# Patient Record
Sex: Male | Born: 1985 | Race: White | Hispanic: No | Marital: Married | Smoking: Never smoker
Health system: Southern US, Community
[De-identification: ages and names within clinical notes are randomized; demographics above are authoritative.]

## PROBLEM LIST (undated history)

## (undated) DIAGNOSIS — R011 Cardiac murmur, unspecified: Secondary | ICD-10-CM

## (undated) DIAGNOSIS — J189 Pneumonia, unspecified organism: Secondary | ICD-10-CM

## (undated) DIAGNOSIS — T8859XA Other complications of anesthesia, initial encounter: Secondary | ICD-10-CM

## (undated) DIAGNOSIS — Z87442 Personal history of urinary calculi: Secondary | ICD-10-CM

---
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
## 2018-07-25 ENCOUNTER — Other Ambulatory Visit: Payer: Self-pay

## 2018-07-25 ENCOUNTER — Encounter (HOSPITAL_COMMUNITY): Payer: Self-pay

## 2018-07-25 ENCOUNTER — Emergency Department (HOSPITAL_COMMUNITY)
Admission: EM | Admit: 2018-07-25 | Discharge: 2018-07-25 | Disposition: A | Discharge status: Home / Self Care | Payer: Managed Care, Other (non HMO) | Attending: Emergency Medicine | Admitting: Emergency Medicine

## 2018-07-25 DIAGNOSIS — N23 Unspecified renal colic: Secondary | ICD-10-CM | POA: Insufficient documentation

## 2018-07-25 DIAGNOSIS — R109 Unspecified abdominal pain: Secondary | ICD-10-CM | POA: Diagnosis present

## 2018-07-25 LAB — CBC
HCT: 46.1 % (ref 39.0–52.0)
Hemoglobin: 15.9 g/dL (ref 13.0–17.0)
MCH: 29.8 pg (ref 26.0–34.0)
MCHC: 34.5 g/dL (ref 30.0–36.0)
MCV: 86.5 fL (ref 80.0–100.0)
Platelets: 258 10*3/uL (ref 150–400)
RBC: 5.33 MIL/uL (ref 4.22–5.81)
RDW: 11.7 % (ref 11.5–15.5)
WBC: 9.2 10*3/uL (ref 4.0–10.5)
nRBC: 0 % (ref 0.0–0.2)

## 2018-07-25 LAB — BASIC METABOLIC PANEL
Anion gap: 9 (ref 5–15)
BUN: 11 mg/dL (ref 6–20)
CO2: 26 mmol/L (ref 22–32)
Calcium: 9.5 mg/dL (ref 8.9–10.3)
Chloride: 104 mmol/L (ref 98–111)
Creatinine, Ser: 0.92 mg/dL (ref 0.61–1.24)
GFR calc Af Amer: >60 mL/min (ref >60)
GFR calc non Af Amer: >60 mL/min (ref >60)
Glucose, Bld: 143 mg/dL — ABNORMAL HIGH (ref 70–99)
Potassium: 3.6 mmol/L (ref 3.5–5.1)
Sodium: 139 mmol/L (ref 135–145)

## 2018-07-25 MED ORDER — ONDANSETRON HCL 4 MG/2ML IJ SOLN
4.0000 mg | Freq: Once | INTRAMUSCULAR | Status: DC
  Filled 2018-07-25: qty 2

## 2018-07-25 MED ORDER — MORPHINE SULFATE (PF) 4 MG/ML IV SOLN
4.0000 mg | Freq: Once | INTRAVENOUS | Status: AC
  Administered 2018-07-25: 4 mg via INTRAVENOUS
  Filled 2018-07-25: qty 1

## 2018-07-25 MED ORDER — KETOROLAC TROMETHAMINE 30 MG/ML IJ SOLN
30.0000 mg | Freq: Once | INTRAMUSCULAR | Status: AC
  Administered 2018-07-25: 30 mg via INTRAVENOUS
  Filled 2018-07-25: qty 1

## 2018-07-25 MED ORDER — SODIUM CHLORIDE 0.9 % IV BOLUS: 1000.0000 mL | Freq: Once | INTRAVENOUS | Status: DC

## 2018-07-25 MED ORDER — ONDANSETRON HCL 4 MG/2ML IJ SOLN
4.0000 mg | Freq: Once | INTRAMUSCULAR | Status: AC
  Administered 2018-07-25: 4 mg via INTRAVENOUS
  Filled 2018-07-25: qty 2

## 2018-07-25 MED ORDER — SODIUM CHLORIDE 0.9 % IV BOLUS
1000.0000 mL | Freq: Once | INTRAVENOUS | Status: AC
  Administered 2018-07-25: 1000 mL via INTRAVENOUS

## 2018-07-25 NOTE — Discharge Instructions (Signed)
Take motrin 800 mg every 6 hrs for the next 2 days   Take oxycodone as needed for break through pain   Take flomax as prescribed.   Call [facility] office tomorrow for appointment   Return to ER if you have worse flank pain, abdominal pain, fever, vomiting.

## 2018-07-25 NOTE — ED Triage Notes (Signed)
Pt. Was at his PCP today and was told to come to us for a kidney stone, It is 8mm and is blocking .  They gave him Oxycodone 5mg  and also Flomax, (Has not taken that)  He took Oxycodone  Around 1:24. Pt. Was vomiting last night not at the present time.   CT scan was completed today at [facility] in [place].  Pt. Is alert and oriented X4  Skin is p/w/d

## 2018-07-25 NOTE — ED Notes (Signed)
Pt stable and ambulatory for discharge, states understanding follow up.  

## 2018-07-25 NOTE — ED Notes (Signed)
Patient transported to X-ray 

## 2018-07-25 NOTE — ED Provider Notes (Signed)
[facility] EMERGENCY DEPARTMENT Provider Note   CSN: [id number] Arrival date & time: 07/25/18  1605     History   Chief Complaint Chief Complaint  Patient presents with  . Nephrolithiasis    HPI Scott Duffy is a 32 y.o. male history of kidney stones here presenting with left flank pain.  Patient states that he has left flank pain for the last 3 to 4 days.  He initially thought he strained his back.  Yesterday it became sharp and associated with vomiting.  He went to see his primary care doctor at [facility] today.  He had an outpatient CT renal stone study that showed 8 mm stone with mild hydro.  He did take oxycodone and was told to come to the ED for evaluation.  He states that his pain is controlled currently.  The history is provided by the patient.    History reviewed. No pertinent past medical history.  There are no active problems to display for this patient.   History reviewed. No pertinent surgical history.      Home Medications    Prior to Admission medications   Not on File    Family History No family history on file.  Social History Social History   Tobacco Use  . Smoking status: Never Smoker  . Smokeless tobacco: Never Used  Substance Use Topics  . Alcohol use: Never    Frequency: Never  . Drug use: Never     Allergies   Patient has no allergy information on record.   Review of Systems Review of Systems  Genitourinary: Positive for flank pain.  All other systems reviewed and are negative.    Physical Exam Updated Vital Signs BP (!) 140/94 (BP Location: Right Arm)   Pulse 75   Temp 98 F (36.7 C) (Oral)   Resp 18   Ht 5\' 10"  (1.778 m)   Wt 87.1 kg   SpO2 98%   BMI 27.55 kg/m   Physical Exam  Constitutional: He is oriented to person, place, and time. He appears well-developed and well-nourished.  Comfortable   HENT:  Head: Normocephalic.  Eyes: Pupils are equal, round, and reactive to light.  Conjunctivae and EOM are normal.  Neck: Normal range of motion. Neck supple.  Cardiovascular: Normal rate, regular rhythm and normal heart sounds.  Pulmonary/Chest: Effort normal and breath sounds normal. No stridor. No respiratory distress.  Abdominal: Soft. Bowel sounds are normal.  + mild L CVAT   Musculoskeletal: Normal range of motion.  Neurological: He is alert and oriented to person, place, and time. No cranial nerve deficit. Coordination normal.  Skin: Skin is warm. Capillary refill takes less than 2 seconds.  Psychiatric: He has a normal mood and affect.  Nursing note and vitals reviewed.    ED Treatments / Results  Labs (all labs ordered are listed, but only abnormal results are displayed) Labs Reviewed  BASIC METABOLIC PANEL - Abnormal; Notable for the following components:      Result Value   Glucose, Bld 143 (*)    All other components within normal limits  CBC  URINALYSIS, ROUTINE W REFLEX MICROSCOPIC    EKG None  Radiology No results found.  Procedures Procedures (including critical care time)  Medications Ordered in ED Medications - No data to display   Initial Impression / Assessment and Plan / ED Course  I have reviewed the triage vital signs and the nursing notes.  Pertinent labs & imaging results that were available  during my care of the patient were reviewed by me and considered in my medical decision making (see chart for details).    Scott Duffy is a 32 y.o. male here with L flank pain. CT outpatient showed 8 mm stone with mild hydro. UA yesterday showed no UTI. Will get CBC, BMP, consult urology. Pain controlled currently and he took oxycodone prior to arrival.   8:30 PM I talked to Dr. Charlynn Court from urology. He reviewed records and recommend that patient call office tomorrow for appointment. Patient initially had some pain and received toradol and morphine and pain controlled. Has motrin and oxycodone and flomax at home. Told him to call  urology office tomorrow.   Final Clinical Impressions(s) / ED Diagnoses   Final diagnoses:  None    ED Discharge Orders    None       Charlynne Pander, MD 07/25/18 2031

## 2018-07-26 ENCOUNTER — Other Ambulatory Visit: Payer: Self-pay

## 2018-07-26 ENCOUNTER — Encounter (HOSPITAL_COMMUNITY): Payer: Self-pay | Admitting: Emergency Medicine

## 2018-07-26 ENCOUNTER — Emergency Department (HOSPITAL_COMMUNITY)
Admission: EM | Admit: 2018-07-26 | Discharge: 2018-07-26 | Disposition: A | Discharge status: Home / Self Care | Payer: Managed Care, Other (non HMO) | Attending: Emergency Medicine | Admitting: Emergency Medicine

## 2018-07-26 DIAGNOSIS — Z79899 Other long term (current) drug therapy: Secondary | ICD-10-CM | POA: Insufficient documentation

## 2018-07-26 DIAGNOSIS — R109 Unspecified abdominal pain: Secondary | ICD-10-CM | POA: Diagnosis present

## 2018-07-26 DIAGNOSIS — N201 Calculus of ureter: Secondary | ICD-10-CM | POA: Diagnosis not present

## 2018-07-26 DIAGNOSIS — N23 Unspecified renal colic: Secondary | ICD-10-CM | POA: Insufficient documentation

## 2018-07-26 MED ORDER — ONDANSETRON 4 MG PO TBDP: 4.0000 mg | ORAL_TABLET | Freq: Three times a day (TID) | ORAL | 0 refills | Status: AC | PRN

## 2018-07-26 MED ORDER — KETOROLAC TROMETHAMINE 60 MG/2ML IM SOLN
30.0000 mg | Freq: Once | INTRAMUSCULAR | Status: AC
  Administered 2018-07-26: 30 mg via INTRAMUSCULAR
  Filled 2018-07-26: qty 2

## 2018-07-26 MED ORDER — ONDANSETRON 4 MG PO TBDP
4.0000 mg | ORAL_TABLET | Freq: Once | ORAL | Status: AC
  Administered 2018-07-26: 4 mg via ORAL
  Filled 2018-07-26: qty 1

## 2018-07-26 NOTE — ED Triage Notes (Signed)
Patient was dx with kidney stones at [facility] earlier tonight. Patient states he can take it because he can not keep water down and is in pain.

## 2018-07-26 NOTE — Discharge Instructions (Addendum)
You may take 600 mg of ibuprofen every 6 hours.  Additionally you can take 650 mg of Tylenol every 6 hours as well, alternating with Motrin every 3 hours.

## 2018-07-26 NOTE — ED Provider Notes (Signed)
[facility]-EMERGENCY DEPT Provider Note  CSN: [id number] Arrival date & time: 07/26/18 0446  Chief Complaint(s) Nephrolithiasis  HPI Scott Duffy is a 32 y.o. male diagnosed with an 8 mm left ureteral stone with mild hydronephrosis seen on CT scan yesterday at an outside facility presents with worsening of his left flank pain.  Patient was seen in [facility] after the CT scan was performed for evaluation.  At that time pain was controlled.  Labs were reassuring.  Urinalysis at the outside facility did not show evidence of urinary tract infection.  Patient reports that his cramping/stabbing pain became severe around 11 PM last night and was not controlled with oral medications that included 800 mg Motrin and 10 mg oxycodone.  States that he had associated nausea and nonbloody nonbilious emesis due to the severity of the pain.  He denied any associated fevers or chills.  Denied any other physical complaints.  Reports that in the last hour his pain has subsided and is now 5 out of 10.  HPI  Past Medical History History reviewed. No pertinent past medical history. There are no active problems to display for this patient.  Home Medication(s) Prior to Admission medications   Medication Sig Start Date End Date Taking? Authorizing Provider  ibuprofen (ADVIL,MOTRIN) 200 MG tablet Take 600 mg by mouth every 6 (six) hours as needed for moderate pain.   Yes [provider]  oxyCODONE (OXY IR/ROXICODONE) 5 MG immediate release tablet Take 5-10 mg by mouth every 4 (four) hours as needed for pain.  07/25/18  Yes [provider]  tamsulosin (FLOMAX) 0.4 MG CAPS capsule Take 0.4 mg by mouth daily. 07/25/18  Yes [provider]  ondansetron (ZOFRAN ODT) 4 MG disintegrating tablet Take 1 tablet (4 mg total) by mouth every 8 (eight) hours as needed for up to 3 days for nausea or vomiting. 07/26/18 07/29/18  Nira Conn, MD                                                                                                  Past Surgical History History reviewed. No pertinent surgical history. Family History History reviewed. No pertinent family history.  Social History Social History   Tobacco Use  . Smoking status: Never Smoker  . Smokeless tobacco: Never Used  Substance Use Topics  . Alcohol use: Never    Frequency: Never  . Drug use: Never   Allergies Patient has no known allergies.  Review of Systems Review of Systems All other systems are reviewed and are negative for acute change except as noted in the HPI  Physical Exam Vital Signs  I have reviewed the triage vital signs BP (!) 147/92 (BP Location: Left Arm)   Pulse 71   Temp 97.6 F (36.4 C) (Oral)   Resp 18   Ht 5\' 10"  (1.778 m)   Wt 87.1 kg   SpO2 99%   BMI 27.55 kg/m   Physical Exam  Constitutional: He is oriented to person, place, and time. He appears well-developed and well-nourished. No distress.  HENT:  Head: Normocephalic and  atraumatic.  Right Ear: External ear normal.  Left Ear: External ear normal.  Nose: Nose normal.  Mouth/Throat: Mucous membranes are normal. No trismus in the jaw.  Eyes: Conjunctivae and EOM are normal. No scleral icterus.  Neck: Normal range of motion and phonation normal.  Cardiovascular: Normal rate and regular rhythm.  Pulmonary/Chest: Effort normal. No stridor. No respiratory distress.  Abdominal: He exhibits no distension. There is no tenderness.  Musculoskeletal: Normal range of motion. He exhibits no edema.  Neurological: He is alert and oriented to person, place, and time.  Skin: He is not diaphoretic.  Psychiatric: He has a normal mood and affect. His behavior is normal.  Vitals reviewed.   ED Results and Treatments Labs (all labs ordered are listed, but only abnormal results are displayed) Labs Reviewed - No data to display                                                                                                                        EKG  EKG Interpretation  Date/Time:    Ventricular Rate:    PR Interval:    QRS Duration:   QT Interval:    QTC Calculation:   R Axis:     Text Interpretation:        Radiology No results found. Pertinent labs & imaging results that were available during my care of the patient were reviewed by me and considered in my medical decision making (see chart for details).  Medications Ordered in ED Medications  ketorolac (TORADOL) injection 30 mg (30 mg Intramuscular Given 07/26/18 0629)  ondansetron (ZOFRAN-ODT) disintegrating tablet 4 mg (4 mg Oral Given 07/26/18 [id number])                                                                                                                                    Procedures Procedures  (including critical care time)  Medical Decision Making / ED Course I have reviewed the nursing notes for this encounter and the patient's prior records (if available in EHR or on provided paperwork).    Patient presents with renal colic.  Pain appears to be subsiding.  He was given additional IM Toradol and allowed to take his home oxycodone.  He was also provided with ODT Zofran.   Following this pain improved significantly.  Patient has been able to tolerate oral intake.  The patient appears reasonably screened and/or stabilized for  discharge and I doubt any other medical condition or other [facility] requiring further screening, evaluation, or treatment in the ED at this time prior to discharge.  The patient is safe for discharge with strict return precautions.   Final Clinical Impression(s) / ED Diagnoses Final diagnoses:  Ureterolithiasis  Renal colic   Disposition: Discharge  Condition: Good  I have discussed the results, Dx and Tx plan with the patient who expressed understanding and agree(s) with the plan. Discharge instructions discussed at great length. The patient was given strict return  precautions who verbalized understanding of the instructions. No further questions at time of discharge.    ED Discharge Orders         Ordered    ondansetron (ZOFRAN ODT) 4 MG disintegrating tablet  Every 8 hours PRN     07/26/18 0737           Follow Up: Marcine Matar, MD [address] [place] [address] [phone]  Schedule an appointment as soon as possible for a visit  For close follow up to assess for renal stone     This chart was dictated using voice recognition software.  Despite best efforts to proofread,  errors can occur which can change the documentation meaning.   Nira Conn, MD 07/26/18 [phone]

## 2018-07-27 ENCOUNTER — Other Ambulatory Visit: Payer: Self-pay | Admitting: Urology

## 2018-08-02 ENCOUNTER — Encounter (HOSPITAL_COMMUNITY): Admission: RE | Payer: Self-pay | Source: Home / Self Care

## 2018-08-02 ENCOUNTER — Ambulatory Visit (HOSPITAL_COMMUNITY): Admission: RE | Admit: 2018-08-02 | Payer: Managed Care, Other (non HMO) | Source: Home / Self Care | Admitting: Urology

## 2018-08-02 SURGERY — LITHOTRIPSY, ESWL
Anesthesia: LOCAL | Laterality: Left

## 2018-08-08 ENCOUNTER — Other Ambulatory Visit: Payer: Self-pay | Admitting: Urology

## 2018-08-08 ENCOUNTER — Encounter (HOSPITAL_COMMUNITY): Payer: Self-pay | Admitting: General Practice

## 2018-08-16 ENCOUNTER — Encounter (HOSPITAL_COMMUNITY): Payer: Self-pay | Admitting: General Practice

## 2018-08-16 ENCOUNTER — Encounter (HOSPITAL_COMMUNITY): Payer: Self-pay | Admitting: Anesthesiology

## 2018-08-16 ENCOUNTER — Encounter (HOSPITAL_COMMUNITY)
Admission: RE | Disposition: A | Discharge status: Home / Self Care | Payer: Self-pay | Source: Home / Self Care | Attending: Urology

## 2018-08-16 ENCOUNTER — Ambulatory Visit (HOSPITAL_COMMUNITY): Payer: Managed Care, Other (non HMO)

## 2018-08-16 ENCOUNTER — Ambulatory Visit (HOSPITAL_COMMUNITY)
Admission: RE | Admit: 2018-08-16 | Discharge: 2018-08-16 | Disposition: A | Discharge status: Home / Self Care | Payer: Managed Care, Other (non HMO) | Attending: Urology | Admitting: Urology

## 2018-08-16 DIAGNOSIS — N201 Calculus of ureter: Secondary | ICD-10-CM | POA: Diagnosis present

## 2018-08-16 DIAGNOSIS — Z87442 Personal history of urinary calculi: Secondary | ICD-10-CM | POA: Diagnosis not present

## 2018-08-16 DIAGNOSIS — Z79899 Other long term (current) drug therapy: Secondary | ICD-10-CM | POA: Diagnosis not present

## 2018-08-16 HISTORY — DX: Personal history of urinary calculi: Z87.442

## 2018-08-16 HISTORY — PX: EXTRACORPOREAL SHOCK WAVE LITHOTRIPSY: SHX1557

## 2018-08-16 SURGERY — LITHOTRIPSY, ESWL
Anesthesia: General | Laterality: Left

## 2018-08-16 MED ORDER — DIPHENHYDRAMINE HCL 25 MG PO CAPS
25.0000 mg | ORAL_CAPSULE | ORAL | Status: AC
  Administered 2018-08-16: 25 mg via ORAL
  Filled 2018-08-16: qty 1

## 2018-08-16 MED ORDER — DIAZEPAM 5 MG PO TABS
10.0000 mg | ORAL_TABLET | ORAL | Status: AC
  Administered 2018-08-16: 10 mg via ORAL
  Filled 2018-08-16: qty 2

## 2018-08-16 MED ORDER — SODIUM CHLORIDE 0.9 % IV SOLN
INTRAVENOUS | Status: DC
  Administered 2018-08-16: 07:00:00 via INTRAVENOUS

## 2018-08-16 MED ORDER — OXYCODONE HCL 5 MG PO TABS: 5.0000 mg | ORAL_TABLET | ORAL | 0 refills | Status: DC | PRN

## 2018-08-16 MED ORDER — CIPROFLOXACIN HCL 500 MG PO TABS
500.0000 mg | ORAL_TABLET | ORAL | Status: AC
  Administered 2018-08-16: 500 mg via ORAL
  Filled 2018-08-16: qty 1

## 2018-08-16 NOTE — Discharge Instructions (Signed)
1 - You may pass small stones and have bloody urine on / off for up to 2 weeks. This is normal.  2 - Call MD or go to ER for fever >102, severe pain / nausea / vomiting not relieved by medications, or acute change in medical status

## 2018-08-16 NOTE — Brief Op Note (Signed)
08/16/2018  9:40 AM  PATIENT:  Vale Haven  32 y.o. male  PRE-OPERATIVE DIAGNOSIS:  LEFT URETERAL STONE  POST-OPERATIVE DIAGNOSIS:  * No post-op diagnosis entered *  PROCEDURE:  Procedure(s): EXTRACORPOREAL SHOCK WAVE LITHOTRIPSY (ESWL) (Left)  SURGEON:  Surgeon(s) and Role:    * Alexis Frock, MD - Primary  PHYSICIAN ASSISTANT:   ASSISTANTS: none   ANESTHESIA:   MAC  EBL:  minimal   BLOOD ADMINISTERED:none  DRAINS: none   LOCAL MEDICATIONS USED:  NONE  SPECIMEN:  No Specimen  DISPOSITION OF SPECIMEN:  N/A  COUNTS:  YES  TOURNIQUET:  * No tourniquets in log *  DICTATION: .Note written in paper chart  PLAN OF CARE: Discharge to home after PACU  PATIENT DISPOSITION:  PACU - hemodynamically stable.   Delay start of Pharmacological VTE agent (>24hrs) due to surgical blood loss or risk of bleeding: yes

## 2018-08-16 NOTE — H&P (Signed)
Scott CreeksDavid Duffy is an 32 y.o. male.    Chief Complaint: Pre-op LEFT Shockwave Lithotripsy  HPI:   1 - LEFT Ureteral Stone - 8x74mm left fusiform stone by ER CT 07/2018 on eval flank pain. Cr <1. No fevers.  Today "Scott Duffy" is seen to proceed with LEFT shockwave lithotripsy, no interval fevers. Stone was proximal about 3 weeks ago, now with distal progression, below SI joint, just mdial to stable phlebolith.   Past Medical History:  Diagnosis Date  . History of kidney stones     Past Surgical History:  Procedure Laterality Date  . WISDOM TOOTH EXTRACTION  2003    History reviewed. No pertinent family history. Social History:  reports that he has never smoked. He has never used smokeless tobacco. He reports that he does not drink alcohol or use drugs.  Allergies: No Known Allergies  No medications prior to admission.    No results found for this or any previous visit (from the past 48 hour(s)). No results found.  Review of Systems  Constitutional: Negative.  Negative for chills and fever.  HENT: Negative.   Eyes: Negative.   Respiratory: Negative.   Cardiovascular: Negative.   Gastrointestinal: Negative.   Genitourinary: Positive for flank pain.  Skin: Negative.   Neurological: Negative.   Endo/Heme/Allergies: Negative.   Psychiatric/Behavioral: Negative.     There were no vitals taken for this visit. Physical Exam  Constitutional: He appears well-developed.  HENT:  Head: Normocephalic.  Eyes: Pupils are equal, round, and reactive to light.  Neck: Normal range of motion.  Cardiovascular: Normal rate.  Respiratory: Effort normal.  GI: Soft.  Genitourinary:    Genitourinary Comments: Mild left CVAT   Musculoskeletal: Normal range of motion.  Neurological: He is alert.  Skin: Skin is warm.  Psychiatric: He has a normal mood and affect.     Assessment/Plan  Proceed as planned with LEFT shockwave lithotripsy. Risks, benefits, alternatives, expected treatment course  discussed previously and reiterated today.   Sebastian Acheheodore Sylvestre Rathgeber, MD 08/16/2018, 5:27 AM

## 2018-08-23 ENCOUNTER — Encounter (HOSPITAL_COMMUNITY): Payer: Self-pay | Admitting: Urology

## 2020-03-14 IMAGING — DX DG ABDOMEN 1V
1 series · 1 of 1 positions shown · non-contrast
Comparison: 07/26/2018.

CLINICAL DATA: Left ureteral stone.  Lithotripsy.

EXAM:
ABDOMEN - 1 VIEW

[abdomen kub]
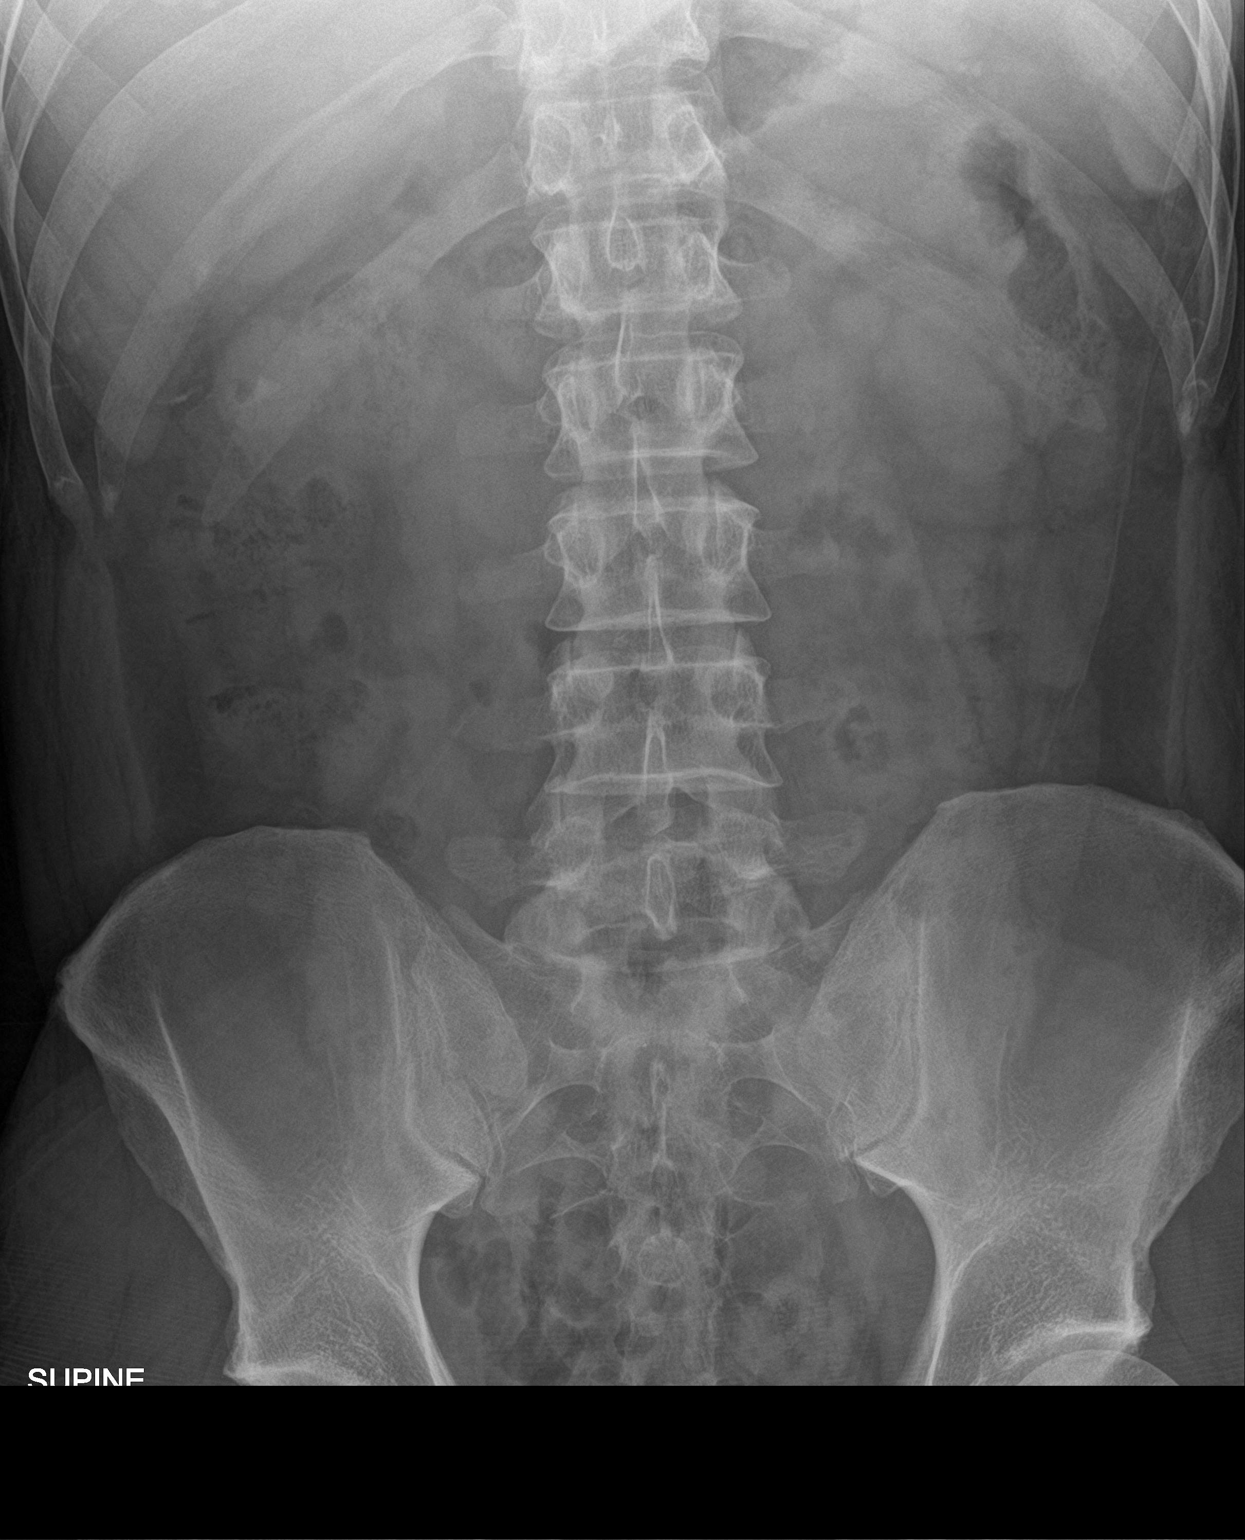

[1 of 1 positions shown; findings below may reference images not displayed]

FINDINGS: There is no evidence for gaseous bowel dilation to suggest
obstruction. The left ureteral stone seen on the previous x-ray has
migrated down to the pelvis and is likely in the distal ureter or
left UVJ. Phlebolith also noted left anatomic pelvis. Visualized
bony anatomy unremarkable.
IMPRESSION: Interval migration of the left ureteral stone now in the distal
ureter/UVJ.

## 2024-04-24 ENCOUNTER — Other Ambulatory Visit: Payer: Self-pay | Admitting: Urology

## 2024-05-24 NOTE — Progress Notes (Addendum)
 Anesthesia Review:  PCP: Comer Pinal LVO 04/10/24  [facility]  Cardiologist : none   PPM/ ICD: Device Orders: Rep Notified:  Chest x-ray : EKG : Echo : Stress test: Cardiac Cath :   Activity level: can do a flgiht of stairs without difficutyl  Sleep Study/ CPAP : sleep study negative per pt  Fasting Blood Sugar :      / Checks Blood Sugar -- times a day:    Blood Thinner/ Instructions /Last Dose: ASA / Instructions/ Last Dose :    PT was on way from [facility] on [address] at time of preop appt.  Completed as a phone appt .  Med hx and preop instructions completed as a phone call.

## 2024-05-24 NOTE — Patient Instructions (Addendum)
 SURGICAL WAITING ROOM VISITATION  Patients having surgery or a procedure may have no more than 2 support people in the waiting area - these visitors may rotate.    Children under the age of 71 must have an adult with them who is not the patient.  Visitors with respiratory illnesses are discouraged from visiting and should remain at home.  If the patient needs to stay at the hospital during part of their recovery, the visitor guidelines for inpatient rooms apply. Pre-op nurse will coordinate an appropriate time for 1 support person to accompany patient in pre-op.  This support person may not rotate.    Please refer to the [facility] website for the visitor guidelines for Inpatients (after your surgery is over and you are in a regular room).       Your procedure is scheduled on:  06/11/2024    Report to [facility] Entrance    Report to admitting at  0730 AM   Call this number if you have problems the morning of surgery [phone]   Do not eat food  or drink liquids :After Midnight.                 If you have questions, please contact your surgeon's office.       Oral Hygiene is also important to reduce your risk of infection.                                    Remember - BRUSH YOUR TEETH THE MORNING OF SURGERY WITH YOUR REGULAR TOOTHPASTE  DENTURES WILL BE REMOVED PRIOR TO SURGERY PLEASE DO NOT APPLY Poly grip OR ADHESIVES!!!   Do NOT smoke after Midnight   Stop all vitamins and herbal supplements 7 days before surgery.   Take these medicines the morning of surgery with A SIP OF WATER:  flomax   DO NOT TAKE ANY ORAL DIABETIC MEDICATIONS DAY OF YOUR SURGERY  Bring CPAP mask and tubing day of surgery.                              You may not have any metal on your body including hair pins, jewelry, and body piercing             Do not wear make-up, lotions, powders, perfumes/cologne, or deodorant  Do not wear nail polish including gel and S&S,  artificial/acrylic nails, or any other type of covering on natural nails including finger and toenails. If you have artificial nails, gel coating, etc. that needs to be removed by a nail salon please have this removed prior to surgery or surgery may need to be canceled/ delayed if the surgeon/ anesthesia feels like they are unable to be safely monitored.   Do not shave  48 hours prior to surgery.               Men may shave face and neck.   Do not bring valuables to the hospital.  IS NOT             RESPONSIBLE   FOR VALUABLES.   Contacts, glasses, dentures or bridgework may not be worn into surgery.   Bring small overnight bag day of surgery.   DO NOT BRING YOUR HOME MEDICATIONS TO THE HOSPITAL. PHARMACY WILL DISPENSE MEDICATIONS LISTED ON YOUR MEDICATION LIST TO YOU DURING YOUR  ADMISSION IN THE HOSPITAL!    Patients discharged on the day of surgery will not be allowed to drive home.  Someone NEEDS to stay with you for the first 24 hours after anesthesia.   Special Instructions: Bring a copy of your healthcare power of attorney and living will documents the day of surgery if you haven't scanned them before.              Please read over the following fact sheets you were given: IF YOU HAVE QUESTIONS ABOUT YOUR PRE-OP INSTRUCTIONS PLEASE CALL [phone].   If you received a COVID test during your pre-op visit  it is requested that you wear a mask when out in public, stay away from anyone that may not be feeling well and notify your surgeon if you develop symptoms. If you test positive for Covid or have been in contact with anyone that has tested positive in the last 10 days please notify you surgeon.    [facility] - Preparing for Surgery Before surgery, you can play an important role.  Because skin is not sterile, your skin needs to be as free of germs as possible.  You can reduce the number of germs on your skin by washing with CHG (chlorahexidine gluconate) soap before surgery.   CHG is an antiseptic cleaner which kills germs and bonds with the skin to continue killing germs even after washing. Please DO NOT use if you have an allergy to CHG or antibacterial soaps.  If your skin becomes reddened/irritated stop using the CHG and inform your nurse when you arrive at Short Stay. Do not shave (including legs and underarms) for at least 48 hours prior to the first CHG shower.  You may shave your face/neck.  Please follow these instructions carefully:  1.  Shower with CHG Soap the night before surgery ONLY (DO NOT USE THE SOAP THE MORNING OF SURGERY).  2.  If you choose to wash your hair, wash your hair first as usual with your normal  shampoo.  3.  After you shampoo, rinse your hair and body thoroughly to remove the shampoo.                             4.  Use CHG as you would any other liquid soap.  You can apply chg directly to the skin and wash.  Gently with a scrungie or clean washcloth.  5.  Apply the CHG Soap to your body ONLY FROM THE NECK DOWN.   Do   not use on face/ open                           Wound or open sores. Avoid contact with eyes, ears mouth and   genitals (private parts).                       Wash face,  Genitals (private parts) with your normal soap.             6.  Wash thoroughly, paying special attention to the area where your    surgery  will be performed.  7.  Thoroughly rinse your body with warm water from the neck down.  8.  DO NOT shower/wash with your normal soap after using and rinsing off the CHG Soap.                9.  Pat yourself dry with a clean towel.            10.  Wear clean pajamas.            11.  Place clean sheets on your bed the night of your first shower and do not  sleep with pets. Day of Surgery : Do not apply any CHG, lotions/deodorants the morning of surgery.  Please wear clean clothes to the hospital/surgery center.  FAILURE TO FOLLOW THESE INSTRUCTIONS MAY RESULT IN THE CANCELLATION OF YOUR SURGERY  PATIENT  SIGNATURE_________________________________  NURSE SIGNATURE__________________________________  ________________________________________________________________________

## 2024-05-27 ENCOUNTER — Other Ambulatory Visit: Payer: Self-pay

## 2024-05-27 ENCOUNTER — Encounter (HOSPITAL_COMMUNITY)
Admission: RE | Admit: 2024-05-27 | Discharge: 2024-05-27 | Disposition: A | Discharge status: Home / Self Care | Source: Ambulatory Visit | Attending: Urology | Admitting: Urology

## 2024-05-27 ENCOUNTER — Encounter (HOSPITAL_COMMUNITY): Payer: Self-pay | Admitting: Urology

## 2024-05-27 DIAGNOSIS — Z01818 Encounter for other preprocedural examination: Secondary | ICD-10-CM

## 2024-06-09 NOTE — H&P (Signed)
 Scott Duffy is a 38 yo male who presents to discuss a vasectomy. He has four children 4-11. He has a history of a stone with ESWL in 2019. He may have had a stone in December but is asymptomatic now and has no other GU history.     ALLERGIES: Nkda    MEDICATIONS: Omeprazole  Zyrtec     GU PSH: ESWL, Left - 2019       PSH Notes: [place] teeth   NON-GU PSH: No Non-GU PSH    GU PMH: Encounter for sterilization, He is a good candidate for a vasectomy but has a compact scrotum and had a vasovagal reaction to the exam. He will need to be done in the OR. I reviewed the risks of bleeding, infection, testicular atrophy, chronic pain and failure of the procedure as well as anesthetic complications. I also reviewed the need for a semen specimen in 3 months to confirm the outcome. he was given our pamphlet to review and the postoperative instructions and expectations were reviewed. - 12/28/2022 Ureteral calculus (Acute), Left, After discussing the options for management he said he wanted to try to pass the stone but his wife is scheduled to get a C-section in a week or so and he elected to proceed with scheduling of his lithotripsy and will contact us  if he passes his stone in the interim. - 2019    NON-GU PMH: GERD    FAMILY HISTORY: 2 daughters - Daughter 1 son - Son Kidney Stones - Grandfather   SOCIAL HISTORY: Marital Status: Married Preferred Language: English; Ethnicity: Not Hispanic Or Latino; Race: White Current Smoking Status: Patient has never smoked.   Tobacco Use Assessment Completed: Used Tobacco in last 30 days? Has never drank.  Drinks 2 caffeinated drinks per day. 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  Gastrointestinal (Upper):   Patient denies nausea, vomiting, and indigestion/ heartburn.  Gastrointestinal (Lower):   Patient denies diarrhea and constipation.  Constitutional:   Patient denies fever, night sweats, weight loss, and fatigue.  Skin:   Patient denies skin rash/ lesion and itching.  Eyes:   Patient denies blurred vision and double vision.  Ears/ Nose/ Throat:   Patient denies sore throat and sinus problems.  Hematologic/Lymphatic:   Patient denies swollen glands and easy bruising.  Cardiovascular:   Patient denies leg swelling and chest pains.  Respiratory:   Patient denies cough and shortness of breath.  Endocrine:   Patient denies excessive thirst.  Musculoskeletal:   Patient denies back pain and joint pain.  Neurological:   Patient denies headaches and dizziness.  Psychologic:   Patient denies depression and anxiety.   VITAL SIGNS: None   MULTI-SYSTEM PHYSICAL EXAMINATION:    Constitutional: Well-nourished. No physical deformities. Normally developed. Good grooming.  Respiratory: Normal breath sounds. No labored breathing, no use of accessory muscles.   Cardiovascular: Regular rate and rhythm. No murmur, no gallop.      Complexity of Data:  Records Review:   Previous Patient Records  Urine Test Review:   Urinalysis   PROCEDURES:          Visit Complexity - G2211 Chronic management  Urinalysis Dipstick Dipstick Cont'd  Color: Yellow Bilirubin: Neg mg/dL  Appearance: Clear Ketones: Neg mg/dL  Specific Gravity: 8.974 Blood: Neg ery/uL  pH: 5.5 Protein: Neg mg/dL  Glucose: Neg mg/dL Urobilinogen: 0.2 mg/dL    Nitrites: Neg    Leukocyte Esterase: Neg leu/uL    ASSESSMENT:      ICD-10 Details  1 GU:   Encounter for sterilization - Z30.2 Chronic, Stable - He wants to proceed with a vasectomy as we discussed before. I will get it set up in the OR as he had a vasovagal reaction that was severe just with the exam at his last  assessment.   He is a good candidate for a vasectomy.   I reviewed the risks of bleeding, infection, testicular atrophy, chronic pain and failure of the procedure. I also reviewed the need for a semen specimen in 3 months to confirm the outcome. he was given our pamphlet to review and the postoperative instructions and expectations were reviewed.       PLAN:           Schedule Return Visit/Planned Activity: Next Available Appointment - Schedule Surgery  Procedure: Unspecified Date - Vasectomy - [phone] Notes: Next available

## 2024-06-11 ENCOUNTER — Encounter (HOSPITAL_COMMUNITY): Payer: Self-pay | Admitting: Urology

## 2024-06-11 ENCOUNTER — Encounter (HOSPITAL_COMMUNITY)
Admission: RE | Disposition: A | Discharge status: Home / Self Care | Payer: Self-pay | Source: Home / Self Care | Attending: Urology

## 2024-06-11 ENCOUNTER — Ambulatory Visit (HOSPITAL_COMMUNITY): Payer: Self-pay | Admitting: Medical

## 2024-06-11 ENCOUNTER — Ambulatory Visit (HOSPITAL_COMMUNITY)
Admission: RE | Admit: 2024-06-11 | Discharge: 2024-06-11 | Disposition: A | Discharge status: Home / Self Care | Attending: Urology | Admitting: Urology

## 2024-06-11 ENCOUNTER — Ambulatory Visit (HOSPITAL_BASED_OUTPATIENT_CLINIC_OR_DEPARTMENT_OTHER): Payer: Self-pay | Admitting: Certified Registered"

## 2024-06-11 DIAGNOSIS — K219 Gastro-esophageal reflux disease without esophagitis: Secondary | ICD-10-CM | POA: Insufficient documentation

## 2024-06-11 DIAGNOSIS — Z302 Encounter for sterilization: Secondary | ICD-10-CM | POA: Diagnosis not present

## 2024-06-11 DIAGNOSIS — Z01818 Encounter for other preprocedural examination: Secondary | ICD-10-CM

## 2024-06-11 HISTORY — DX: Other complications of anesthesia, initial encounter: T88.59XA

## 2024-06-11 HISTORY — DX: Cardiac murmur, unspecified: R01.1

## 2024-06-11 HISTORY — PX: VASECTOMY: SHX75

## 2024-06-11 HISTORY — DX: Pneumonia, unspecified organism: J18.9

## 2024-06-11 LAB — CBC
HCT: 43.8 % (ref 39.0–52.0)
Hemoglobin: 15.4 g/dL (ref 13.0–17.0)
MCH: 30.5 pg (ref 26.0–34.0)
MCHC: 35.2 g/dL (ref 30.0–36.0)
MCV: 86.7 fL (ref 80.0–100.0)
Platelets: 262 K/uL (ref 150–400)
RBC: 5.05 MIL/uL (ref 4.22–5.81)
RDW: 12.1 % (ref 11.5–15.5)
WBC: 6.1 K/uL (ref 4.0–10.5)
nRBC: 0 % (ref 0.0–0.2)

## 2024-06-11 SURGERY — VASECTOMY
Anesthesia: General | Site: Scrotum | Laterality: Bilateral

## 2024-06-11 MED ORDER — ONDANSETRON HCL 4 MG/2ML IJ SOLN
INTRAMUSCULAR | Status: DC | PRN
  Administered 2024-06-11: 4 mg via INTRAVENOUS

## 2024-06-11 MED ORDER — LIDOCAINE HCL (CARDIAC) PF 100 MG/5ML IV SOSY
PREFILLED_SYRINGE | INTRAVENOUS | Status: DC | PRN
  Administered 2024-06-11: 100 mg via INTRAVENOUS

## 2024-06-11 MED ORDER — OXYCODONE HCL 5 MG PO TABS: 5.0000 mg | ORAL_TABLET | ORAL | Status: DC | PRN

## 2024-06-11 MED ORDER — MIDAZOLAM HCL 2 MG/2ML IJ SOLN
INTRAMUSCULAR | Status: AC
  Filled 2024-06-11: qty 2

## 2024-06-11 MED ORDER — HYDROCODONE-ACETAMINOPHEN 5-325 MG PO TABS: 1.0000 | ORAL_TABLET | Freq: Four times a day (QID) | ORAL | 0 refills | Status: AC | PRN

## 2024-06-11 MED ORDER — ACETAMINOPHEN 10 MG/ML IV SOLN: 1000.0000 mg | Freq: Once | INTRAVENOUS | Status: DC | PRN

## 2024-06-11 MED ORDER — LIDOCAINE HCL (PF) 2 % IJ SOLN
INTRAMUSCULAR | Status: AC
  Filled 2024-06-11: qty 5

## 2024-06-11 MED ORDER — AMISULPRIDE (ANTIEMETIC) 5 MG/2ML IV SOLN
10.0000 mg | Freq: Once | INTRAVENOUS | Status: AC
  Administered 2024-06-11: 10 mg via INTRAVENOUS

## 2024-06-11 MED ORDER — MIDAZOLAM HCL (PF) 2 MG/2ML IJ SOLN
INTRAMUSCULAR | Status: DC | PRN
  Administered 2024-06-11: 2 mg via INTRAVENOUS

## 2024-06-11 MED ORDER — CHLORHEXIDINE GLUCONATE 0.12 % MT SOLN
15.0000 mL | Freq: Once | OROMUCOSAL | Status: AC
  Administered 2024-06-11: 15 mL via OROMUCOSAL

## 2024-06-11 MED ORDER — 0.9 % SODIUM CHLORIDE (POUR BTL) OPTIME
TOPICAL | Status: DC | PRN
Start: 2024-06-11 — End: 2024-06-11
  Administered 2024-06-11: 1000 mL

## 2024-06-11 MED ORDER — PROPOFOL 10 MG/ML IV BOLUS
INTRAVENOUS | Status: AC
  Filled 2024-06-11: qty 20

## 2024-06-11 MED ORDER — FENTANYL CITRATE (PF) 50 MCG/ML IJ SOSY: 25.0000 ug | PREFILLED_SYRINGE | INTRAMUSCULAR | Status: DC | PRN

## 2024-06-11 MED ORDER — FENTANYL CITRATE (PF) 100 MCG/2ML IJ SOLN
INTRAMUSCULAR | Status: AC
  Filled 2024-06-11: qty 2

## 2024-06-11 MED ORDER — PROPOFOL 10 MG/ML IV BOLUS
INTRAVENOUS | Status: DC | PRN
  Administered 2024-06-11: 200 mg via INTRAVENOUS

## 2024-06-11 MED ORDER — ONDANSETRON HCL 4 MG/2ML IJ SOLN
INTRAMUSCULAR | Status: AC
  Filled 2024-06-11: qty 2

## 2024-06-11 MED ORDER — MORPHINE SULFATE (PF) 2 MG/ML IV SOLN: 2.0000 mg | INTRAVENOUS | Status: DC | PRN

## 2024-06-11 MED ORDER — BACITRACIN ZINC 500 UNIT/GM EX OINT
TOPICAL_OINTMENT | CUTANEOUS | Status: AC
  Filled 2024-06-11: qty 28.35

## 2024-06-11 MED ORDER — BUPIVACAINE HCL (PF) 0.5 % IJ SOLN
INTRAMUSCULAR | Status: AC
  Filled 2024-06-11: qty 30

## 2024-06-11 MED ORDER — BUPIVACAINE HCL (PF) 0.5 % IJ SOLN
INTRAMUSCULAR | Status: DC | PRN
  Administered 2024-06-11: 5 mL

## 2024-06-11 MED ORDER — DEXAMETHASONE SOD PHOSPHATE PF 10 MG/ML IJ SOLN
INTRAMUSCULAR | Status: DC | PRN
  Administered 2024-06-11: 8 mg via INTRAVENOUS

## 2024-06-11 MED ORDER — FENTANYL CITRATE (PF) 100 MCG/2ML IJ SOLN
INTRAMUSCULAR | Status: DC | PRN
  Administered 2024-06-11: 50 ug via INTRAVENOUS

## 2024-06-11 MED ORDER — ORAL CARE MOUTH RINSE: 15.0000 mL | Freq: Once | OROMUCOSAL | Status: AC

## 2024-06-11 MED ORDER — CEFAZOLIN SODIUM-DEXTROSE 2-4 GM/100ML-% IV SOLN
2.0000 g | INTRAVENOUS | Status: AC
Start: 2024-06-11 — End: 2024-06-11
  Administered 2024-06-11: 2 g via INTRAVENOUS
  Filled 2024-06-11: qty 100

## 2024-06-11 MED ORDER — ACETAMINOPHEN 325 MG PO TABS: 650.0000 mg | ORAL_TABLET | ORAL | Status: DC | PRN

## 2024-06-11 MED ORDER — SODIUM CHLORIDE 0.9% FLUSH: 3.0000 mL | INTRAVENOUS | Status: DC | PRN

## 2024-06-11 MED ORDER — SODIUM CHLORIDE 0.9 % IV SOLN: 250.0000 mL | INTRAVENOUS | Status: DC | PRN

## 2024-06-11 MED ORDER — LACTATED RINGERS IV SOLN
INTRAVENOUS | Status: DC
Start: 2024-06-11 — End: 2024-06-11

## 2024-06-11 MED ORDER — ACETAMINOPHEN 650 MG RE SUPP: 650.0000 mg | RECTAL | Status: DC | PRN

## 2024-06-11 MED ORDER — SODIUM CHLORIDE 0.9% FLUSH: 3.0000 mL | Freq: Two times a day (BID) | INTRAVENOUS | Status: DC

## 2024-06-11 SURGICAL SUPPLY — 14 items
BAG COUNTER SPONGE SURGICOUNT (BAG) IMPLANT
BLADE SURG 15 STRL LF DISP TIS (BLADE) ×1 IMPLANT
CLIP TI MEDIUM 6 (CLIP) ×1 IMPLANT
COVER SURGICAL LIGHT HANDLE (MISCELLANEOUS) ×1 IMPLANT
GAUZE 4X4 16PLY ~~LOC~~+RFID DBL (SPONGE) ×1 IMPLANT
GLOVE SURG LX STRL 8.0 MICRO (GLOVE) ×1 IMPLANT
GOWN STRL REUS W/ TWL XL LVL3 (GOWN DISPOSABLE) ×1 IMPLANT
KIT TURNOVER KIT A (KITS) ×1 IMPLANT
NDL DENTAL RB 25GX1.25 (NEEDLE) IMPLANT
NEEDLE DENTAL RB 25GX1.25 (NEEDLE) ×1 IMPLANT
PACK BASIC VI WITH GOWN DISP (CUSTOM PROCEDURE TRAY) ×1 IMPLANT
PENCIL SMOKE EVACUATOR (MISCELLANEOUS) IMPLANT
SUT CHROMIC 3 0 SH 27 (SUTURE) ×1 IMPLANT
SUT CHROMIC 4 0 FS 2 27 (SUTURE) IMPLANT

## 2024-06-11 NOTE — Op Note (Signed)
 Procedure: Bilateral vasectomy.  Preop diagnosis: Elective sterilization.  Postop diagnosis: Same.  Surgeon: Dr. Norleen Seltzer.  Anesthesia: General.  Specimen: None.  Drains: None.  EBL: None.  Complications: None.  Indications: Patient is a 38 year old male who had a severe vasovagal reaction in the office during his initial vasectomy consultation exam it was felt that and the procedure in the OR was indicated.  Procedure: He was taken operating room was given Ancef.  A general anesthetic was induced.  He was left in the supine position.  His scrotum was clipped and he was prepped with Betadine solution and draped in usual sterile fashion.  No scalpel vasectomy was performed after initial injection of the scrotal skin with half percent Marcaine.  A total of 5 mL was used.  The sharp and hemostat was used to puncture the skin against the right vas which was being palpated against the anterior scrotal wall.  The vas was dissected out and a 3 cm section was skeletonized.  A 1 cm section was removed with the scissors.  The distal lumen was fulgurated with the Bovie.  A fascial interposition was then performed using 3-0 chromic.  The testicular end was then doubly ligated with a 3-0 chromic.  This was then repeated on the left side.  Once hemostasis was assured a subcuticular skin closure was performed with a single 4-0 chromic suture.  Wound was cleansed and a dressing was applied and secured with mesh panties.  His anesthetic was reversed and he was moved recovery in stable condition.  There were no complications.  He is aware that the procedure is not immediately effective and he will need semen Alysis in 3 months prior to relying on the vasectomy for birth control.

## 2024-06-11 NOTE — Interval H&P Note (Signed)
 History and Physical Interval Note: no change  06/11/2024 8:42 AM  Scott Duffy  has presented today for surgery, with the diagnosis of VASECTOMY.  The various methods of treatment have been discussed with the patient and family. After consideration of risks, benefits and other options for treatment, the patient has consented to  Procedure(s): VASECTOMY (Bilateral) as a surgical intervention.  The patient's history has been reviewed, patient examined, no change in status, stable for surgery.  I have reviewed the patient's chart and labs.  Questions were answered to the patient's satisfaction.     [name]

## 2024-06-11 NOTE — Anesthesia Preprocedure Evaluation (Addendum)
 Anesthesia Evaluation  Patient identified by MRN, date of birth, ID band Patient awake    Reviewed: Allergy & Precautions, NPO status , Patient's Chart, lab work & pertinent test results  Airway Mallampati: II  TM Distance: >3 FB Neck ROM: Full    Dental no notable dental hx.    Pulmonary neg pulmonary ROS   Pulmonary exam normal        Cardiovascular negative cardio ROS  Rhythm:Regular Rate:Normal     Neuro/Psych negative neurological ROS  negative psych ROS   GI/Hepatic Neg liver ROS,GERD  Medicated,,  Endo/Other  negative endocrine ROS    Renal/GU negative Renal ROS  negative genitourinary   Musculoskeletal negative musculoskeletal ROS (+)    Abdominal Normal abdominal exam  (+)   Peds  Hematology negative hematology ROS (+)   Anesthesia Other Findings   Reproductive/Obstetrics                              Anesthesia Physical Anesthesia Plan  ASA: 2  Anesthesia Plan: General   Post-op Pain Management:    Induction: Intravenous  PONV Risk Score and Plan: 1 and Ondansetron , Dexamethasone, Propofol infusion, Midazolam and Treatment may vary due to age or medical condition  Airway Management Planned: Mask and LMA  Additional Equipment: None  Intra-op Plan:   Post-operative Plan: Extubation in OR  Informed Consent: I have reviewed the patients History and Physical, chart, labs and discussed the procedure including the risks, benefits and alternatives for the proposed anesthesia with the patient or authorized representative who has indicated his/her understanding and acceptance.     Dental advisory given  Plan Discussed with: CRNA  Anesthesia Plan Comments:          Anesthesia Quick Evaluation

## 2024-06-11 NOTE — Anesthesia Postprocedure Evaluation (Signed)
 Anesthesia Post Note  Patient: Scott Duffy  Procedure(s) Performed: VASECTOMY (Bilateral: Scrotum)     Patient location during evaluation: PACU Anesthesia Type: General Level of consciousness: awake and alert Pain management: pain level controlled Vital Signs Assessment: post-procedure vital signs reviewed and stable Respiratory status: spontaneous breathing, nonlabored ventilation, respiratory function stable and patient connected to nasal cannula oxygen Cardiovascular status: blood pressure returned to baseline and stable Postop Assessment: no apparent nausea or vomiting Anesthetic complications: no   No notable events documented.  Last Vitals:  Vitals:   06/11/24 1115 06/11/24 1130  BP:  (!) 122/90  Pulse: 66 67  Resp:  20  Temp:  (!) 36.4 C  SpO2: 100% 100%    Last Pain:  Vitals:   06/11/24 1130  TempSrc:   PainSc: 0-No pain                 Cordella P Yakir Wenke

## 2024-06-11 NOTE — Anesthesia Procedure Notes (Signed)
 Procedure Name: LMA Insertion Date/Time: 06/11/2024 9:06 AM  Performed by: Metta Andrea NOVAK, CRNAPre-anesthesia Checklist: Patient identified, Emergency Drugs available, Suction available, Patient being monitored and Timeout performed Patient Re-evaluated:Patient Re-evaluated prior to induction Oxygen Delivery Method: Circle system utilized Preoxygenation: Pre-oxygenation with 100% oxygen Induction Type: IV induction Ventilation: Mask ventilation without difficulty LMA: LMA inserted LMA Size: 4.0 Number of attempts: 1 Tube secured with: Tape Dental Injury: Teeth and Oropharynx as per pre-operative assessment

## 2024-06-11 NOTE — Transfer of Care (Signed)
 Immediate Anesthesia Transfer of Care Note  Patient: Scott Duffy  Procedure(s) Performed: VASECTOMY (Bilateral: Scrotum)  Patient Location: PACU  Anesthesia Type:General  Level of Consciousness: awake, drowsy, and patient cooperative  Airway & Oxygen Therapy: Patient Spontanous Breathing and Patient connected to face mask oxygen  Post-op Assessment: Report given to RN and Post -op Vital signs reviewed and stable  Post vital signs: Reviewed and stable  Last Vitals:  Vitals Value Taken Time  BP 121/78 06/11/24 10:05  Temp    Pulse 66 06/11/24 10:06  Resp 0 06/11/24 10:06  SpO2 100 % 06/11/24 10:06  Vitals shown include unfiled device data.  Last Pain:  Vitals:   06/11/24 0802  TempSrc: Oral  PainSc: 0-No pain         Complications: No notable events documented.

## 2024-06-12 ENCOUNTER — Encounter (HOSPITAL_COMMUNITY): Payer: Self-pay | Admitting: Urology
